# Patient Record
Sex: Male | Born: 1985 | Race: Black or African American | Hispanic: No | Marital: Single | Smoking: Current some day smoker
Health system: Southern US, Community
[De-identification: ages and names within clinical notes are randomized; demographics above are authoritative.]

## PROBLEM LIST (undated history)

## (undated) DIAGNOSIS — H00019 Hordeolum externum unspecified eye, unspecified eyelid: Secondary | ICD-10-CM

## (undated) DIAGNOSIS — E669 Obesity, unspecified: Secondary | ICD-10-CM

## (undated) DIAGNOSIS — M25569 Pain in unspecified knee: Secondary | ICD-10-CM

## (undated) HISTORY — DX: Hordeolum externum unspecified eye, unspecified eyelid: H00.019

## (undated) HISTORY — DX: Pain in unspecified knee: M25.569

## (undated) HISTORY — DX: Obesity, unspecified: E66.9

---
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
## 2009-10-09 ENCOUNTER — Emergency Department (HOSPITAL_COMMUNITY): Admission: EM | Admit: 2009-10-09 | Discharge: 2009-10-09 | Payer: Self-pay | Admitting: Emergency Medicine

## 2010-10-06 ENCOUNTER — Emergency Department (HOSPITAL_COMMUNITY)
Admission: EM | Admit: 2010-10-06 | Discharge: 2010-10-06 | Discharge status: Against Medical Advice | Payer: Managed Care, Other (non HMO) | Attending: Emergency Medicine | Admitting: Emergency Medicine

## 2010-10-06 DIAGNOSIS — Z0389 Encounter for observation for other suspected diseases and conditions ruled out: Secondary | ICD-10-CM | POA: Insufficient documentation

## 2011-12-27 ENCOUNTER — Encounter (HOSPITAL_COMMUNITY): Payer: Self-pay | Admitting: Family Medicine

## 2011-12-27 ENCOUNTER — Emergency Department (HOSPITAL_COMMUNITY)
Admission: EM | Admit: 2011-12-27 | Discharge: 2011-12-27 | Disposition: A | Discharge status: Home / Self Care | Payer: Self-pay | Attending: Emergency Medicine | Admitting: Emergency Medicine

## 2011-12-27 DIAGNOSIS — H60399 Other infective otitis externa, unspecified ear: Secondary | ICD-10-CM | POA: Insufficient documentation

## 2011-12-27 DIAGNOSIS — F17200 Nicotine dependence, unspecified, uncomplicated: Secondary | ICD-10-CM | POA: Insufficient documentation

## 2011-12-27 DIAGNOSIS — H6091 Unspecified otitis externa, right ear: Secondary | ICD-10-CM

## 2011-12-27 MED ORDER — CIPROFLOXACIN-DEXAMETHASONE 0.3-0.1 % OT SUSP: 4.0000 [drp] | Freq: Two times a day (BID) | OTIC | Status: AC

## 2011-12-27 NOTE — ED Provider Notes (Signed)
History     CSN: [id number]  Arrival date & time 12/27/11  1924   First MD Initiated Contact with Patient 12/27/11 2042      Chief Complaint  Patient presents with  . Otalgia  . Headache    (Consider location/radiation/quality/duration/timing/severity/associated sxs/prior treatment) HPI  26 year old male presents complaining of right ear pain. Patient reports for the past 3 days he has pain to his right ear. Describe pain as a sharp and throbbing sensation. Pain worsened when he lays on the right side. He also notice some discharge from it. He denies hearing changes, ringing ears, or recent trauma. He does admits to using Q-tips in his ear and regular basis. He denies history of diabetes. Denies any medication changes. He denies recent swimming in lake, pond, pool.    History reviewed. No pertinent past medical history.  History reviewed. No pertinent past surgical history.  History reviewed. No pertinent family history.  History  Substance Use Topics  . Smoking status: Current Every Day Smoker  . Smokeless tobacco: Not on file  . Alcohol Use: No      Review of Systems  All other systems reviewed and are negative.    Allergies  Review of patient's allergies indicates no known allergies.  Home Medications   Current Outpatient Rx  Name Route Sig Dispense Refill  . BC HEADACHE POWDER PO Oral Take 1 packet by mouth once.      BP 128/69  Pulse 71  Temp 98.4 F (36.9 C) (Oral)  Resp 20  Ht 6\' 1"  (1.854 m)  SpO2 99%  Physical Exam  Nursing note and vitals reviewed. Constitutional: He appears well-developed and well-nourished. No distress.  HENT:  Head: Normocephalic and atraumatic.  Right Ear: There is drainage and tenderness. No swelling. No foreign bodies. No mastoid tenderness. Tympanic membrane is not perforated. No middle ear effusion. No hemotympanum. No decreased hearing is noted.  Left Ear: External ear normal.  Mouth/Throat: Oropharynx is clear and  moist. No oropharyngeal exudate.       R ear canal with erythema and pustular discharge, no edema, TM appears normal.  Tenderness to tragus and ear lobe with palpation.    Eyes: Conjunctivae normal are normal.  Neck: Normal range of motion. Neck supple.  Lymphadenopathy:    He has no cervical adenopathy.  Neurological: He is alert.  Skin: Skin is warm. No rash noted.  Psychiatric: He has a normal mood and affect.    ED Course  Procedures (including critical care time)  Labs Reviewed - No data to display No results found.   No diagnosis found.  1. Otitis externa, Right ear  MDM  Otitis externa 2/2 using q-tip.  Will treat with ciprodex.  Recommend avoid q-tip.  ent referral given.     BP 128/69  Pulse 71  Temp 98.4 F (36.9 C) (Oral)  Resp 20  Ht 6\' 1"  (1.854 m)  SpO2 99%      Fayrene Helper, PA-C 12/27/11 2123

## 2011-12-27 NOTE — ED Provider Notes (Signed)
Medical screening examination/treatment/procedure(s) were performed by non-physician practitioner and as supervising physician I was immediately available for consultation/collaboration.  Cheri Guppy, MD 12/27/11 [phone]

## 2011-12-27 NOTE — ED Notes (Signed)
Pt reports right-sided earache beginning x 3 days ago and states pain is going into the right side of his head now.

## 2013-04-19 ENCOUNTER — Emergency Department (HOSPITAL_COMMUNITY)
Admission: EM | Admit: 2013-04-19 | Discharge: 2013-04-19 | Disposition: A | Discharge status: Home / Self Care | Payer: Managed Care, Other (non HMO) | Attending: Emergency Medicine | Admitting: Emergency Medicine

## 2013-04-19 ENCOUNTER — Encounter (HOSPITAL_COMMUNITY): Payer: Self-pay | Admitting: Emergency Medicine

## 2013-04-19 DIAGNOSIS — S058X9A Other injuries of unspecified eye and orbit, initial encounter: Secondary | ICD-10-CM | POA: Insufficient documentation

## 2013-04-19 DIAGNOSIS — F17200 Nicotine dependence, unspecified, uncomplicated: Secondary | ICD-10-CM | POA: Insufficient documentation

## 2013-04-19 DIAGNOSIS — IMO0002 Reserved for concepts with insufficient information to code with codable children: Secondary | ICD-10-CM | POA: Insufficient documentation

## 2013-04-19 DIAGNOSIS — Y92410 Unspecified street and highway as the place of occurrence of the external cause: Secondary | ICD-10-CM | POA: Insufficient documentation

## 2013-04-19 DIAGNOSIS — Y9389 Activity, other specified: Secondary | ICD-10-CM | POA: Insufficient documentation

## 2013-04-19 DIAGNOSIS — M545 Low back pain, unspecified: Secondary | ICD-10-CM

## 2013-04-19 DIAGNOSIS — S0501XA Injury of conjunctiva and corneal abrasion without foreign body, right eye, initial encounter: Secondary | ICD-10-CM

## 2013-04-19 MED ORDER — FLUORESCEIN SODIUM 1 MG OP STRP
1.0000 | ORAL_STRIP | Freq: Once | OPHTHALMIC | Status: DC
  Filled 2013-04-19: qty 1

## 2013-04-19 MED ORDER — IBUPROFEN 800 MG PO TABS: 800.0000 mg | ORAL_TABLET | Freq: Three times a day (TID) | ORAL | Status: DC | PRN

## 2013-04-19 MED ORDER — TETRACAINE HCL 0.5 % OP SOLN
2.0000 [drp] | Freq: Once | OPHTHALMIC | Status: DC
  Filled 2013-04-19: qty 2

## 2013-04-19 MED ORDER — CYCLOBENZAPRINE HCL 10 MG PO TABS: 10.0000 mg | ORAL_TABLET | Freq: Three times a day (TID) | ORAL | Status: DC | PRN

## 2013-04-19 MED ORDER — CIPROFLOXACIN HCL 0.3 % OP SOLN: 1.0000 [drp] | Freq: Four times a day (QID) | OPHTHALMIC | Status: DC
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
## 2013-04-19 NOTE — ED Notes (Signed)
Tetracaine and fluorescein strip placed at bedside per [employer] [facility] request

## 2013-04-20 NOTE — ED Provider Notes (Signed)
Medical screening examination/treatment/procedure(s) were performed by non-physician practitioner and as supervising physician I was immediately available for consultation/collaboration.  EKG Interpretation   None        Symphonie Schneiderman, MD 04/20/13 0025 

## 2013-04-23 ENCOUNTER — Emergency Department (HOSPITAL_COMMUNITY)
Admission: EM | Admit: 2013-04-23 | Discharge: 2013-04-23 | Disposition: A | Discharge status: Home / Self Care | Payer: Managed Care, Other (non HMO) | Attending: Emergency Medicine | Admitting: Emergency Medicine

## 2013-04-23 ENCOUNTER — Encounter (HOSPITAL_COMMUNITY): Payer: Self-pay | Admitting: Emergency Medicine

## 2013-04-23 DIAGNOSIS — F17200 Nicotine dependence, unspecified, uncomplicated: Secondary | ICD-10-CM | POA: Insufficient documentation

## 2013-04-23 DIAGNOSIS — R52 Pain, unspecified: Secondary | ICD-10-CM | POA: Insufficient documentation

## 2013-04-23 DIAGNOSIS — J069 Acute upper respiratory infection, unspecified: Secondary | ICD-10-CM | POA: Insufficient documentation

## 2013-04-23 MED ORDER — PSEUDOEPHEDRINE HCL 30 MG PO TABS: 30.0000 mg | ORAL_TABLET | ORAL | Status: DC | PRN

## 2013-04-23 NOTE — ED Provider Notes (Signed)
Medical screening examination/treatment/procedure(s) were performed by non-physician practitioner and as supervising physician I was immediately available for consultation/collaboration.  EKG Interpretation   None         Kelvis Berger, MD 04/23/13 2247 

## 2013-04-23 NOTE — ED Notes (Signed)
Pt from home reports dry, non-productive cough/cold s/sx 5 days. Pt has been taking mucinex and dayquil with releif, but s/sx are not going away. Pt denies N/V/D. Pt is A&O and in NAD

## 2013-04-23 NOTE — Discharge Instructions (Signed)
Cough, Adult  A cough is a reflex that helps clear your throat and airways. It can help heal the body or may be a reaction to an irritated airway. A cough may only last 2 or 3 weeks (acute) or may last more than 8 weeks (chronic).  CAUSES Acute cough:  Viral or bacterial infections. Chronic cough:  Infections.  Allergies.  Asthma.  Post-nasal drip.  Smoking.  Heartburn or acid reflux.  Some medicines.  Chronic lung problems (COPD).  Cancer. SYMPTOMS   Cough.  Fever.  Chest pain.  Increased breathing rate.  High-pitched whistling sound when breathing (wheezing).  Colored mucus that you cough up (sputum). TREATMENT   A bacterial cough may be treated with antibiotic medicine.  A viral cough must run its course and will not respond to antibiotics.  Your caregiver may recommend other treatments if you have a chronic cough. HOME CARE INSTRUCTIONS   Only take over-the-counter or prescription medicines for pain, discomfort, or fever as directed by your caregiver. Use cough suppressants only as directed by your caregiver.  Use a cold steam vaporizer or humidifier in your bedroom or home to help loosen secretions.  Sleep in a semi-upright position if your cough is worse at night.  Rest as needed.  Stop smoking if you smoke. SEEK IMMEDIATE MEDICAL CARE IF:   You have pus in your sputum.  Your cough starts to worsen.  You cannot control your cough with suppressants and are losing sleep.  You begin coughing up blood.  You have difficulty breathing.  You develop pain which is getting worse or is uncontrolled with medicine.  You have a fever. MAKE SURE YOU:   Understand these instructions.  Will watch your condition.  Will get help right away if you are not doing well or get worse. Document Released: 09/29/2010 Document Revised: 06/25/2011 Document Reviewed: 09/29/2010 [employer] Patient Information 2014 [employer].  Upper Respiratory Infection,  Adult An upper respiratory infection (URI) is also sometimes known as the common cold. The upper respiratory tract includes the nose, sinuses, throat, trachea, and bronchi. Bronchi are the airways leading to the lungs. Most people improve within 1 week, but symptoms can last up to 2 weeks. A residual cough may last even longer.  CAUSES Many different viruses can infect the tissues lining the upper respiratory tract. The tissues become irritated and inflamed and often become very moist. Mucus production is also common. A cold is contagious. You can easily spread the virus to others by oral contact. This includes kissing, sharing a glass, coughing, or sneezing. Touching your mouth or nose and then touching a surface, which is then touched by another person, can also spread the virus. SYMPTOMS  Symptoms typically develop 1 to 3 days after you come in contact with a cold virus. Symptoms vary from person to person. They may include:  Runny nose.  Sneezing.  Nasal congestion.  Sinus irritation.  Sore throat.  Loss of voice (laryngitis).  Cough.  Fatigue.  Muscle aches.  Loss of appetite.  Headache.  Low-grade fever. DIAGNOSIS  You might diagnose your own cold based on familiar symptoms, since most people get a cold 2 to 3 times a year. Your caregiver can confirm this based on your exam. Most importantly, your caregiver can check that your symptoms are not due to another disease such as strep throat, sinusitis, pneumonia, asthma, or epiglottitis. Blood tests, throat tests, and X-rays are not necessary to diagnose a common cold, but they may sometimes be helpful   in excluding other more serious diseases. Your caregiver will decide if any further tests are required. RISKS AND COMPLICATIONS  You may be at risk for a more severe case of the common cold if you smoke cigarettes, have chronic heart disease (such as heart failure) or lung disease (such as asthma), or if you have a weakened immune  system. The very young and very old are also at risk for more serious infections. Bacterial sinusitis, middle ear infections, and bacterial pneumonia can complicate the common cold. The common cold can worsen asthma and chronic obstructive pulmonary disease (COPD). Sometimes, these complications can require emergency medical care and may be life-threatening. PREVENTION  The best way to protect against getting a cold is to practice good hygiene. Avoid oral or hand contact with people with cold symptoms. Wash your hands often if contact occurs. There is no clear evidence that vitamin C, vitamin E, echinacea, or exercise reduces the chance of developing a cold. However, it is always recommended to get plenty of rest and practice good nutrition. TREATMENT  Treatment is directed at relieving symptoms. There is no cure. Antibiotics are not effective, because the infection is caused by a virus, not by bacteria. Treatment may include:  Increased fluid intake. Sports drinks offer valuable electrolytes, sugars, and fluids.  Breathing heated mist or steam (vaporizer or shower).  Eating chicken soup or other clear broths, and maintaining good nutrition.  Getting plenty of rest.  Using gargles or lozenges for comfort.  Controlling fevers with ibuprofen or acetaminophen as directed by your caregiver.  Increasing usage of your inhaler if you have asthma. Zinc gel and zinc lozenges, taken in the first 24 hours of the common cold, can shorten the duration and lessen the severity of symptoms. Pain medicines may help with fever, muscle aches, and throat pain. A variety of non-prescription medicines are available to treat congestion and runny nose. Your caregiver can make recommendations and may suggest nasal or lung inhalers for other symptoms.  HOME CARE INSTRUCTIONS   Only take over-the-counter or prescription medicines for pain, discomfort, or fever as directed by your caregiver.  Use a warm mist humidifier  or inhale steam from a shower to increase air moisture. This may keep secretions moist and make it easier to breathe.  Drink enough water and fluids to keep your urine clear or pale yellow.  Rest as needed.  Return to work when your temperature has returned to normal or as your caregiver advises. You may need to stay home longer to avoid infecting others. You can also use a face mask and careful hand washing to prevent spread of the virus. SEEK MEDICAL CARE IF:   After the first few days, you feel you are getting worse rather than better.  You need your caregiver's advice about medicines to control symptoms.  You develop chills, worsening shortness of breath, or brown or red sputum. These may be signs of pneumonia.  You develop yellow or brown nasal discharge or pain in the face, especially when you bend forward. These may be signs of sinusitis.  You develop a fever, swollen neck glands, pain with swallowing, or white areas in the back of your throat. These may be signs of strep throat. SEEK IMMEDIATE MEDICAL CARE IF:   You have a fever.  You develop severe or persistent headache, ear pain, sinus pain, or chest pain.  You develop wheezing, a prolonged cough, cough up blood, or have a change in your usual mucus (if you have chronic   lung disease).  You develop sore muscles or a stiff neck. Document Released: 09/26/2000 Document Revised: 06/25/2011 Document Reviewed: 08/04/2010 [employer] Patient Information 2014 [employer].  

## 2013-04-23 NOTE — ED Provider Notes (Signed)
CSN: [id number]     Arrival date & time 04/23/13  1629 History  This chart was scribed for non-physician practitioner, Wylene SimmerFrancis Arrow Tomko, PA-C,working with Gwyneth SproutWhitney Plunkett, MD, by Karle PlumberJennifer Tensley, ED Scribe.  This patient was seen in room WTR8/WTR8 and the patient's care was started at 6:17 PM.  Chief Complaint  Patient presents with  . Cough  . Nasal Congestion  . Generalized Body Aches   The history is provided by the patient. No language interpreter was used.   HPI Comments:  Donald GreenspanDarren Clay is a 28 y.o. male who presents to the Emergency Department complaining of severe, intermittent dry cough for the past five days. Pt reports associated rhinorrhea, intermittent sore throat and intermittent CP yesterday. His partner reports shortness of breath while pt sleeps and states she believes he has sleep apnea. Pt reports sick contacts at work. Pt denies fever, appetite change, nausea, or vomiting. He reports tobacco use of 1-2 cigars daily for the past three years.  History reviewed. No pertinent past medical history. No past surgical history on file. No family history on file. History  Substance Use Topics  . Smoking status: Current Every Day Smoker    Types: Cigars  . Smokeless tobacco: Not on file  . Alcohol Use: No    Review of Systems  Constitutional: Positive for chills. Negative for fever and appetite change.       Generalized body aches.  HENT: Positive for congestion, rhinorrhea and sore throat.   Respiratory: Positive for cough.   Gastrointestinal: Negative for nausea and vomiting.  All other systems reviewed and are negative.   Allergies  Review of patient's allergies indicates no known allergies.  Home Medications   Current Outpatient Rx  Name  Route  Sig  Dispense  Refill  . Aspirin-Salicylamide-Caffeine (BC HEADACHE POWDER PO)   Oral   Take 1 packet by mouth once.         . ciprofloxacin (CILOXAN) 0.3 % ophthalmic solution   Right Eye   Place 1-2 drops into the  right eye 4 (four) times daily. X 5 days   5 mL   0   . cyclobenzaprine (FLEXERIL) 10 MG tablet   Oral   Take 1 tablet (10 mg total) by mouth 3 (three) times daily as needed for muscle spasms.   10 tablet   0   . ibuprofen (ADVIL,MOTRIN) 200 MG tablet   Oral   Take 200 mg by mouth every 6 (six) hours as needed for moderate pain (tooth ache).         Marland Kitchen. ibuprofen (ADVIL,MOTRIN) 800 MG tablet   Oral   Take 1 tablet (800 mg total) by mouth every 8 (eight) hours as needed for mild pain or moderate pain.   15 tablet   0    Triage Vitals: BP 141/64  Pulse 78  Temp(Src) 98.4 F (36.9 C) (Oral)  Resp 18  SpO2 98% Physical Exam  Nursing note and vitals reviewed. Constitutional: He is oriented to person, place, and time. He appears well-developed and well-nourished. No distress.  HENT:  Head: Normocephalic and atraumatic.  Right Ear: External ear normal.  Left Ear: External ear normal.  Mouth/Throat: Oropharynx is clear and moist. No oropharyngeal exudate.  Boggy nasal mucosa, clear rhinorrhea   Eyes: Conjunctivae are normal. Pupils are equal, round, and reactive to light. No scleral icterus.  Neck: Normal range of motion. Neck supple.  Cardiovascular: Normal rate, regular rhythm and normal heart sounds.  Exam reveals no gallop and  no friction rub.   No murmur heard. Pulmonary/Chest: Effort normal and breath sounds normal. No respiratory distress. He has no wheezes. He has no rales. He exhibits no tenderness.  Abdominal: Soft. Bowel sounds are normal. He exhibits no distension. There is no tenderness. There is no rebound and no guarding.  Musculoskeletal: Normal range of motion. He exhibits no edema and no tenderness.  Lymphadenopathy:    He has no cervical adenopathy.  Neurological: He is alert and oriented to person, place, and time. He exhibits normal muscle tone. Coordination normal.  Skin: Skin is warm and dry. No rash noted. No erythema. No pallor.  Psychiatric: He has a  normal mood and affect. His behavior is normal. Judgment and thought content normal.    ED Course  Procedures (including critical care time) DIAGNOSTIC STUDIES: Oxygen Saturation is 98% on RA, normal by my interpretation.   COORDINATION OF CARE: 6:22 PM- Will treat with a nasal decongestant. Pt verbalizes understanding and agrees to plan.  Medications - No data to display  Labs Review Labs Reviewed - No data to display Imaging Review No results found.  EKG Interpretation   None       MDM  URI  Patient is otherwise healthy male who presents with mild URI symptoms, there is no evidence of influenza, he reports dry and hacking cough which I believe is mainly from the post nasal drip - will start on nasal decongestant for symptoms.  I personally performed the services described in this documentation, which was scribed in my presence. The recorded information has been reviewed and is accurate.    Izola Price Marisue Humble, [place] 04/23/13 1829

## 2013-05-04 ENCOUNTER — Emergency Department (HOSPITAL_COMMUNITY): Payer: Managed Care, Other (non HMO)

## 2013-05-04 ENCOUNTER — Encounter (HOSPITAL_COMMUNITY): Payer: Self-pay | Admitting: Emergency Medicine

## 2013-05-04 ENCOUNTER — Emergency Department (HOSPITAL_COMMUNITY)
Admission: EM | Admit: 2013-05-04 | Discharge: 2013-05-04 | Disposition: A | Discharge status: Home / Self Care | Payer: Managed Care, Other (non HMO) | Attending: Emergency Medicine | Admitting: Emergency Medicine

## 2013-05-04 DIAGNOSIS — R509 Fever, unspecified: Secondary | ICD-10-CM | POA: Insufficient documentation

## 2013-05-04 DIAGNOSIS — R110 Nausea: Secondary | ICD-10-CM | POA: Insufficient documentation

## 2013-05-04 DIAGNOSIS — J40 Bronchitis, not specified as acute or chronic: Secondary | ICD-10-CM

## 2013-05-04 DIAGNOSIS — F17200 Nicotine dependence, unspecified, uncomplicated: Secondary | ICD-10-CM | POA: Insufficient documentation

## 2013-05-04 MED ORDER — PREDNISONE (PAK) 10 MG PO TABS: ORAL_TABLET | Freq: Every day | ORAL | Status: AC

## 2013-05-04 MED ORDER — HYDROCODONE-HOMATROPINE 5-1.5 MG/5ML PO SYRP: 5.0000 mL | ORAL_SOLUTION | Freq: Four times a day (QID) | ORAL | Status: DC | PRN

## 2013-05-04 NOTE — ED Provider Notes (Signed)
CSN: [id number]     Arrival date & time 05/04/13  1750 History  This chart was scribed for non-physician practitioner, Trixie Dredge, PA-C working with Juliet Rude. Rubin Payor, MD by Luisa Dago, ED scribe. This patient was seen in room WTR8/WTR8 and the patient's care was started at 6:57 PM.    Chief Complaint  Patient presents with  . Cough  . Fever    The history is provided by the patient. No language interpreter was used.   HPI Comments: Donald Clay is a 28 y.o. male who presents to the Emergency Department complaining of a dry cough that started 3 weeks ago. Pt is also complaining of associated occasional fever that also started 3 weeks ago, with a measured TMAX of 104. ED temperature is now 99.8. He reports taking Nyquil and other OTC medication with minimal to no relief. Pt is complaining of associated nausea and congestion. He denies any abdominal pain, emesis, or leg swelling. Pt did not get a Flu vaccine this season.    No past medical history on file. No past surgical history on file. No family history on file. History  Substance Use Topics  . Smoking status: Current Every Day Smoker    Types: Cigars  . Smokeless tobacco: Not on file  . Alcohol Use: No    Review of Systems  Constitutional: Positive for fever.  HENT: Positive for congestion.   Respiratory: Positive for cough.   Gastrointestinal: Positive for nausea. Negative for vomiting.  Musculoskeletal: Negative for joint swelling.    Allergies  Review of patient's allergies indicates no known allergies.  Home Medications   Current Outpatient Rx  Name  Route  Sig  Dispense  Refill  . cyclobenzaprine (FLEXERIL) 10 MG tablet   Oral   Take 1 tablet (10 mg total) by mouth 3 (three) times daily as needed for muscle spasms.   10 tablet   0   . DM-Doxylamine-Acetaminophen (NYQUIL COLD & FLU PO)   Oral   Take 30 mLs by mouth at bedtime as needed (flu-like symptoms).         Marland Kitchen ibuprofen (ADVIL,MOTRIN) 800 MG  tablet   Oral   Take 1 tablet (800 mg total) by mouth every 8 (eight) hours as needed for mild pain or moderate pain.   15 tablet   0   . pseudoephedrine (SUDAFED) 30 MG tablet   Oral   Take 1 tablet (30 mg total) by mouth every 4 (four) hours as needed for congestion.   30 tablet   0    Triage vitals: BP 141/81  Pulse 94  Temp(Src) 99.3 F (37.4 C) (Oral)  Resp 20  Ht 6\' 1"  (1.854 m)  Wt 320 lb (145.151 kg)  BMI 42.23 kg/m2  SpO2 100%  Physical Exam  Nursing note and vitals reviewed. Constitutional: He appears well-developed and well-nourished. No distress.  HENT:  Head: Normocephalic and atraumatic.  Nose: Right sinus exhibits no maxillary sinus tenderness and no frontal sinus tenderness. Left sinus exhibits no maxillary sinus tenderness and no frontal sinus tenderness.  Mouth/Throat: Oropharynx is clear and moist.  Neck: Neck supple.  Cardiovascular: Normal rate and regular rhythm.   Pulmonary/Chest: Effort normal and breath sounds normal. No respiratory distress. He has no wheezes. He has no rales.  Abdominal: Soft. He exhibits no distension and no mass. There is no tenderness. There is no rebound and no guarding.  Lymphadenopathy:    He has no cervical adenopathy.  Neurological: He is alert. He exhibits  normal muscle tone.  Skin: He is not diaphoretic.    ED Course  Procedures (including critical care time)  DIAGNOSTIC STUDIES: Oxygen Saturation is 100% on RA, normal by my interpretation.    COORDINATION OF CARE: 6:55 PM- Will order CXR. Pt advised of plan for treatment and pt agrees.  Labs Review Labs Reviewed - No data to display Imaging Review Dg Chest 2 View  05/04/2013   CLINICAL DATA:  Cough and fever.  EXAM: CHEST  2 VIEW  COMPARISON:  None.  FINDINGS: Heart size and mediastinal contours are within normal limits. Both lungs are clear. Visualized skeletal structures are unremarkable.  IMPRESSION: Negative exam.   Electronically Signed   By: Drusilla Kannerhomas   Dalessio M.D.   On: 05/04/2013 19:30    EKG Interpretation   None       MDM   1. Bronchitis    Afebrile, nontoxic patient with cough x 3 weeks.  No SOB.  O2 100%. Temp is normal here.  CXR is negative.  Likely bronchitis.  D/C home with prednisone, hycodan.  Discussed result, findings, treatment, and follow up  with patient.  Pt given return precautions.  Pt verbalizes understanding and agrees with plan.      I personally performed the services described in this documentation, which was scribed in my presence. The recorded information has been reviewed and is accurate.    Trixie Dredgemily Drina Jobst, PA-C 05/04/13 1945

## 2013-05-04 NOTE — Discharge Instructions (Signed)
Read the information below.  Use the prescribed medication as directed.  Please discuss all new medications with your pharmacist.  You may return to the Emergency Department at any time for worsening condition or any new symptoms that concern you.  If you develop high fevers that do not resolve with tylenol or ibuprofen, you have difficulty swallowing or breathing, or you are unable to tolerate fluids by mouth, return to the ER for a recheck.    ° ° ° °Bronchitis °Bronchitis is inflammation of the airways that extend from the windpipe into the lungs (bronchi). The inflammation often causes mucus to develop, which leads to a cough. If the inflammation becomes severe, it may cause shortness of breath. °CAUSES  °Bronchitis may be caused by:  °· Viral infections.   °· Bacteria.   °· Cigarette smoke.   °· Allergens, pollutants, and other irritants.   °SIGNS AND SYMPTOMS  °The most common symptom of bronchitis is a frequent cough that produces mucus. Other symptoms include: °· Fever.   °· Body aches.   °· Chest congestion.   °· Chills.   °· Shortness of breath.   °· Sore throat.   °DIAGNOSIS  °Bronchitis is usually diagnosed through a medical history and physical exam. Tests, such as chest X-rays, are sometimes done to rule out other conditions.  °TREATMENT  °You may need to avoid contact with whatever caused the problem (smoking, for example). Medicines are sometimes needed. These may include: °· Antibiotics. These may be prescribed if the condition is caused by bacteria. °· Cough suppressants. These may be prescribed for relief of cough symptoms.   °· Inhaled medicines. These may be prescribed to help open your airways and make it easier for you to breathe.   °· Steroid medicines. These may be prescribed for those with recurrent (chronic) bronchitis. °HOME CARE INSTRUCTIONS °· Get plenty of rest.   °· Drink enough fluids to keep your urine clear or pale yellow (unless you have a medical condition that requires fluid  restriction). Increasing fluids may help thin your secretions and will prevent dehydration.   °· Only take over-the-counter or prescription medicines as directed by your health care provider. °· Only take antibiotics as directed. Make sure you finish them even if you start to feel better. °· Avoid secondhand smoke, irritating chemicals, and strong fumes. These will make bronchitis worse. If you are a smoker, quit smoking. Consider using nicotine gum or skin patches to help control withdrawal symptoms. Quitting smoking will help your lungs heal faster.   °· Put a cool-mist humidifier in your bedroom at night to moisten the air. This may help loosen mucus. Change the water in the humidifier daily. You can also run the hot water in your shower and sit in the bathroom with the door closed for 5 10 minutes.   °· Follow up with your health care provider as directed.   °· Wash your hands frequently to avoid catching bronchitis again or spreading an infection to others.   °SEEK MEDICAL CARE IF: °Your symptoms do not improve after 1 week of treatment.  °SEEK IMMEDIATE MEDICAL CARE IF: °· Your fever increases. °· You have chills.   °· You have chest pain.   °· You have worsening shortness of breath.   °· You have bloody sputum. °· You faint.   °· You have lightheadedness. °· You have a severe headache.   °· You vomit repeatedly. °MAKE SURE YOU:  °· Understand these instructions. °· Will watch your condition. °· Will get help right away if you are not doing well or get worse. °Document Released: 04/02/2005 Document Revised: 01/21/2013 Document Reviewed: 11/25/2012 °  ExitCare® Patient Information ©2014 [employer]. ° ° ° °Emergency Department Resource Guide °1) Find a Doctor and Pay Out of Pocket °Although you won't have to find out who is covered by your insurance plan, it is a good idea to ask around and get recommendations. You will then need to call the office and see if the doctor you have chosen will accept you as a new  patient and what types of options they offer for patients who are self-pay. Some doctors offer discounts or will set up payment plans for their patients who do not have insurance, but you will need to ask so you aren't surprised when you get to your appointment. ° °2) Contact Your Local Health Department °Not all health departments have doctors that can see patients for sick visits, but many do, so it is worth a call to see if yours does. If you don't know where your local health department is, you can check in your phone book. The CDC also has a tool to help you locate your state's health department, and many state websites also have listings of all of their local health departments. ° °3) Find a Walk-in Clinic °If your illness is not likely to be very severe or complicated, you may want to try a walk in clinic. These are popping up all over the country in pharmacies, drugstores, and shopping centers. They're usually staffed by nurse practitioners or physician assistants that have been trained to treat common illnesses and complaints. They're usually fairly quick and inexpensive. However, if you have serious medical issues or chronic medical problems, these are probably not your best option. ° °No Primary Care Doctor: °- Call [facility] at  832-8000 - they can help you locate a primary care doctor that  accepts your insurance, provides certain services, etc. °- Physician Referral Service- 1-800-533-3463 ° °Chronic Pain Problems: °Organization         Address  Phone   Notes  °[facility]  (336) 297-2271 Patients need to be referred by their primary care doctor.  ° °Medication Assistance: °Organization         Address  Phone   Notes  °[place] [facility] [address] °[place], [place] [address] (336) 641-8030 --Must be a resident of [place] °-- Must have NO insurance coverage whatsoever (no Medicaid/ Medicare, etc.) °-- The pt. MUST have a primary  care doctor that directs their care regularly and follows them in the community °  °[facility]  (866) 331-1348   °[employer]  (888) 892-1162   ° °Agencies that provide inexpensive medical care: °Organization         Address  Phone   Notes  °[facility]  (336) 832-8035   °[facility]    (336) 832-7272   °[facility] [address] °[place], [place] [address] (336) 832-4777   °[facility] [address], °[place] (336) 271-4999   °[facility]    (336) 373-0678   °[facility]    (336) 272-1050   °[facility] ° [address], [place] Phone:  (336) 832-4444, Fax:  (336) 832-4440 Hours of Operation:  9 am - 6 pm, M-F.  Also accepts Medicaid/Medicare and self-pay.  °[facility] ° [address],  Phone: (336) 832-3150, Fax: (336) 832-3151. Hours of Operation:  8:30 am - 5:30 pm, M-F.    Also accepts Medicaid and self-pay.  °[facility] [place] [address], [place] Phone: (336) 878-6027   °[facility] [address], [place], [place] (336)723-1848, Ext. 123 Mondays & Thursdays: 7-9 AM.  First 15 patients are seen on a first come, first serve basis. °  ° °Medicaid-accepting [place] Providers: ° °Organization         Address  Phone   Notes  °[facility] [address], [place] (336) 641-2100 Also accepts self-pay patients.  °[facility] [address], [place] ° (336) 856-9996   °[facility] [address], [place] (336) 288-8857   °[facility] [address] [place] [address], [place] (336) 299-7000   °Veita Bland [address], [place]  ° (336) 373-1557 Only accepts [place] Access Medicaid patients after they have their name applied to their card.  ° °Self-Pay (no insurance) in [place]: ° °Organization         Address  Phone   Notes  °Sickle Cell Patients, [facility] [address], [place] (336) 832-1970   °[facility] [address], [place] (336) 832-4400   °[facility] ° [address], [place] (336) 992-4800   °[facility]/Dr. Osei-Bonsu ° [address] [place] [address], [place] or [address], [place] (336) 841-8500 Phone number for both [place] and [place] locations is the same.  °[facility] [address], [place] (336) 299-0000   °[facility] [address] [place] [address], [place] or [address] (336) 852-7530 °(336) 878-2260   °[facility] [address], [place] (336) 350-1642, phone; (336) 294-5005, fax Sees patients 1st and 3rd Saturday of every month.  Must not qualify for public or private insurance (i.e. Medicaid, Medicare, [place] Health Choice, Veterans' Benefits) • Household income should be no more than 200% of the poverty level •The clinic cannot treat you if you are pregnant or think you are pregnant • Sexually transmitted diseases are not treated at the clinic.  ° ° °Dental Care: °Organization         Address  Phone  Notes  °[place] [facility] [address], [place] (336) 641-6152 Accepts children up to age 21 who are enrolled in Medicaid or [place] Health Choice; pregnant women with a Medicaid card; and children who have applied for Medicaid or [place] Health Choice, but were declined, whose parents can pay a reduced fee at time of service.  °[place] [facility] [place]  [address], [place] (336) 641-7733 Accepts children up to age 21 who are enrolled in Medicaid or [place] Health Choice; pregnant women with a Medicaid card; and children who have applied for Medicaid or [place] Health Choice, but were declined, whose parents can  pay a reduced fee at time of service.  °[facility] ° [address],  (336) 641-4533 Patients are seen by appointment only. Walk-ins are not accepted. [facility] will see patients 18 years of age and older. °Monday - Tuesday (8am-5pm) °Most Wednesdays (8:30-5pm) °$30 per visit, cash only  °[facility] ° [address], [place] (336) 641-4533 Patients are seen by appointment only. Walk-ins are not accepted. [facility]   see patients 26 years of age and older. One Wednesday Evening (Monthly: Volunteer Based).  $30 per visit, cash only  [facility]  [phone] for adults; Children under age 154, call Graduate Pediatric Dentistry at [phone]) [phone]. Children aged 634-14, please call [phone] to request a pediatric application.  Dental services are provided in all areas of dental care including fillings, crowns and bridges, complete and partial dentures, implants, gum treatment, root canals, and extractions. Preventive care is also provided. Treatment is provided to both adults and children. Patients are selected via a lottery and there is often a waiting list.   [facility] [address], [place]  [phone]) [phone] [email or web address]   [facility] [address], [place], [place] [phone], Ext. 123 Second and Fourth Thursday of each month, opens at 6:30 AM; Clinic ends at 9 AM.  Patients are seen on a first-come first-served basis, and a limited number are seen during each clinic.   [facility]  [address], [place], [place] [phone]) [phone]   Eligibility Requirements You must have lived in [place], [place], or [place] for at least the last three months.   You cannot be eligible for state or federal sponsored [place]healthcare insurance, including CIG[employer]eterans Administration, [place]icaid, or [employer]icare.   You generally cannot be eligible for healthcare  insurance through your employer.    How to apply: Eligibility screenings are held every Tuesday and Wednesday afternoon from 1:00 pm until 4:00 pm. You do not need an appointment for the interview!  [facility] [address], [place], [place] [phone]   [facility]  [phone]   [facility]  [phone]   [facility]  [phone]) [phone]    Behavioral Health Resources in the Community: Intensive Outpatient Programs Organization         Address  Phone  Notes  [facility] [address], [place], [place] [phone]   [facility] [address], [place], [place] [phone]   [facility] [address], [place], [place]  [phone]   [facility] [address],  [place], [place] [phone] or [phone]   Substance Abuse Resources Organization         Address  Phone  Notes  [facility]  [phone]([phone]) [phone]   [facility]  (249) 282-1732(337) [phone]   [facility]  [phone]   [facility]  [phone]   Residential & Outpatient Substance Abuse Program  [phone]   Psychological Services Organization         Address  Phone  Notes  [facility]  [phone]- [phone]   [facility]  [phone]- [phone]   [facility] [address], [place] [phone] or [phone]    Mobile Crisis Teams Organization         Address  Phone  Notes  [facility], Mobile Crisis Care Unit  [phone]   [facility]  [address] [place], [place] [phone]   Doristine LocksSharon DeEsch [address] [place] [phone]    Self-Help/Support Groups Organization         Address  Phone             Notes  [facility] [place] - variety of support groups  336- [phone] Call for more information  Narcotics Anonymous (NA),  [facility] [address],  Dr, °[place]  2 meetings at this location  ° °Residential Treatment Programs °Organization         Address  Phone  Notes  °[facility] [address],    °[place]  1-866-801-8205   °[facility] ° [address], [place], [place] [phone]   °[facility] [address], [place] [phone] Admissions: 8am-3pm M-F  °[facility] [address]    °[place], [place] [phone]   °[facility] [address], [place], [place] [phone]   °[facility] [address]  °[place], [place] [phone]   °[facility] - Intensive Outpatient [address], [place], [place] 336-852-3033   °[facility] [address]  °[place], [place] 1-877-615-2722 or 336-784-9470   °[facility] [address] [place], [place] 336-227-7417 Accepts Medicaid  °[facility] [address]  °[place] 1-800-659-3381 Substance Abuse/Addiction Treatment  ° °[place] Behavioral Health Resources °Organization         Address  Phone  Notes  °[facility]  (888) 581-9988   °Julie Brannon, PhD [address] [place], [place]   (336) 349-5553 or (336) 951-0000   °[facility]   [address] °[place], [place] (336) 349-4454   °[facility] [address], [place], [place] (336) 342-8316 Insurance/Medicaid/sponsorship through [facility]  °[facility] [address]                                    [place], [place] (336) [phone] Therapy/tele-psych/case  °[facility] [address]  ° [place], [place] (336) [phone]    °Dr. Arfeen  (336) 349-4544   °[facility] [place]  [employer] [place] [facility] 1) [address], [place] °2) [address], [place] °3)  [address], [place] (336) 349-3220 °(336) 342-7768 ° °(336) 342-8140    °[place] Child Abuse Hotline (336) 342-1394 or (336) 342-3537 (After Hours)    ° ° ° °

## 2013-05-04 NOTE — ED Provider Notes (Signed)
Medical screening examination/treatment/procedure(s) were performed by non-physician practitioner and as supervising physician I was immediately available for consultation/collaboration.  EKG Interpretation   None        Marguriete Wootan R. Nidal Rivet, MD 05/04/13 2349 

## 2013-05-04 NOTE — ED Notes (Addendum)
Patient reports hving a productive cough with yellow sputum, slight body aches, and a low grade temperature x 3 weeks. Patient reports that he was here a week ago for the same, but not getting any better.

## 2014-02-20 ENCOUNTER — Emergency Department (HOSPITAL_COMMUNITY)
Admission: EM | Admit: 2014-02-20 | Discharge: 2014-02-20 | Disposition: A | Discharge status: Home / Self Care | Payer: Managed Care, Other (non HMO) | Attending: Emergency Medicine | Admitting: Emergency Medicine

## 2014-02-20 ENCOUNTER — Encounter (HOSPITAL_COMMUNITY): Payer: Self-pay | Admitting: Emergency Medicine

## 2014-02-20 DIAGNOSIS — Z720 Tobacco use: Secondary | ICD-10-CM | POA: Diagnosis not present

## 2014-02-20 DIAGNOSIS — Z79899 Other long term (current) drug therapy: Secondary | ICD-10-CM | POA: Diagnosis not present

## 2014-02-20 DIAGNOSIS — K0889 Other specified disorders of teeth and supporting structures: Secondary | ICD-10-CM

## 2014-02-20 DIAGNOSIS — K088 Other specified disorders of teeth and supporting structures: Secondary | ICD-10-CM | POA: Diagnosis present

## 2014-02-20 DIAGNOSIS — Z7952 Long term (current) use of systemic steroids: Secondary | ICD-10-CM | POA: Diagnosis not present

## 2014-02-20 MED ORDER — TRAMADOL HCL 50 MG PO TABS: 50.0000 mg | ORAL_TABLET | Freq: Four times a day (QID) | ORAL | Status: DC | PRN

## 2014-02-20 NOTE — ED Provider Notes (Signed)
CSN: [id number]     Arrival date & time 02/20/14  1808 History  This chart was scribed for Santiago GladHeather Levell Tavano, PA with Juliet RudeNathan R. Rubin PayorPickering, MD by Tonye RoyaltyJoshua Chen, ED Scribe. This patient was seen in room WTR5/WTR5 and the patient's care was started at 6:45 PM.    Chief Complaint  Patient presents with  . Dental Pain   The history is provided by the patient. No language interpreter was used.    HPI Comments: Donald Clay is a 28 y.o. male who presents to the Emergency Department complaining of left sided dental pain and headache with onset 1 month ago. He states he suspects it is due to his left lower wisdom tooth that is coming in sideways, but he states the pain is too generalized to be sure. He reports associated swelling to his face upon waking, which has now resolved. He states he has seen a dentist, but refused having the tooth removed because his dentist does not use anesthesia. He states he has been using 800mg  Ibuprofen and an antibiotic for his symptoms. He denies fever, chills, nausea, vomiting, or difficulty swallowing.  No past medical history on file. No past surgical history on file. Family History  Problem Relation Age of Onset  . Hypertension Mother   . Asthma Mother    History  Substance Use Topics  . Smoking status: Current Some Day Smoker    Types: Cigars  . Smokeless tobacco: Not on file  . Alcohol Use: No    Review of Systems  Constitutional: Negative for fever and chills.  HENT: Positive for dental problem and facial swelling.   Gastrointestinal: Negative for nausea and vomiting.  Neurological: Positive for headaches.  All other systems reviewed and are negative.     Allergies  Review of patient's allergies indicates no known allergies.  Home Medications   Prior to Admission medications   Medication Sig Start Date End Date Taking? Authorizing Provider  cyclobenzaprine (FLEXERIL) 10 MG tablet Take 1 tablet (10 mg total) by mouth 3 (three) times daily as  needed for muscle spasms. 04/19/13   Trixie DredgeEmily West, PA-C  DM-Doxylamine-Acetaminophen (NYQUIL COLD & FLU PO) Take 30 mLs by mouth at bedtime as needed (flu-like symptoms).    Historical Provider, MD  HYDROcodone-homatropine (HYCODAN) 5-1.5 MG/5ML syrup Take 5 mLs by mouth every 6 (six) hours as needed for cough. 05/04/13   Trixie DredgeEmily West, PA-C  ibuprofen (ADVIL,MOTRIN) 800 MG tablet Take 1 tablet (800 mg total) by mouth every 8 (eight) hours as needed for mild pain or moderate pain. 04/19/13   Trixie DredgeEmily West, PA-C  predniSONE (STERAPRED UNI-PAK) 10 MG tablet Take by mouth daily. Day 1: take 6 tabs.  Day 2: 5 tabs  Day 3: 4 tabs  Day 4: 3 tabs  Day 5: 2 tabs  Day 6: 1 tab 05/04/13   Trixie DredgeEmily West, PA-C  pseudoephedrine (SUDAFED) 30 MG tablet Take 1 tablet (30 mg total) by mouth every 4 (four) hours as needed for congestion. 04/23/13   Izola PriceFrances C. [place]ford, PA-C   There were no vitals taken for this visit. Physical Exam  Constitutional: He is oriented to person, place, and time. He appears well-developed and well-nourished.  HENT:  Head: Normocephalic and atraumatic.  Mouth/Throat: No trismus in the jaw.  No sublingual tenderness or swelling No submental lymphadenopathy or submandibular lymphadenopathy Partial eruption of the left lower wisdom tooth  Eyes: Conjunctivae are normal.  Neck: Normal range of motion. Neck supple.  Cardiovascular: Normal rate, regular rhythm and  normal heart sounds.   No murmur heard. Pulmonary/Chest: Effort normal and breath sounds normal. No respiratory distress. He has no wheezes. He has no rales.  Musculoskeletal: Normal range of motion.  Neurological: He is alert and oriented to person, place, and time.  Skin: Skin is warm and dry.  Psychiatric: He has a normal mood and affect.  Nursing note and vitals reviewed.   ED Course  Procedures (including critical care time)  DIAGNOSTIC STUDIES: Oxygen Saturation is 99% on room air, normal by my interpretation.    COORDINATION OF  CARE: 6:49 PM Discussed treatment plan with patient at beside, including referral to a dentist, pain medication, but no antibiotic since he is already on one. The patient agrees with the plan and has no further questions at this time.   Labs Review Labs Reviewed - No data to display  Imaging Review No results found.   EKG Interpretation None      MDM   Final diagnoses:  None   Patient with toothache.  No gross abscess.  Exam unconcerning for Ludwig's angina or spread of infection.  Will treat with pain medicine.  He is currently taking an antibiotic.  Urged patient to follow-up with dentist.      Santiago GladHeather Kacen Mellinger, PA-C 02/21/14 2352  Juliet RudeNathan R. Rubin PayorPickering, MD 02/22/14 0025

## 2014-02-20 NOTE — Discharge Instructions (Signed)
°Emergency Department Resource Guide °1) Find a Doctor and Pay Out of Pocket °Although you won't have to find out who is covered by your insurance plan, it is a good idea to ask around and get recommendations. You will then need to call the office and see if the doctor you have chosen will accept you as a new patient and what types of options they offer for patients who are self-pay. Some doctors offer discounts or will set up payment plans for their patients who do not have insurance, but you will need to ask so you aren't surprised when you get to your appointment. ° °2) Contact Your Local Health Department °Not all health departments have doctors that can see patients for sick visits, but many do, so it is worth a call to see if yours does. If you don't know where your local health department is, you can check in your phone book. The CDC also has a tool to help you locate your state's health department, and many state websites also have listings of all of their local health departments. ° °3) Find a Walk-in Clinic °If your illness is not likely to be very severe or complicated, you may want to try a walk in clinic. These are popping up all over the country in pharmacies, drugstores, and shopping centers. They're usually staffed by nurse practitioners or physician assistants that have been trained to treat common illnesses and complaints. They're usually fairly quick and inexpensive. However, if you have serious medical issues or chronic medical problems, these are probably not your best option. ° °No Primary Care Doctor: °- Call [facility] at  832-8000 - they can help you locate a primary care doctor that  accepts your insurance, provides certain services, etc. °- Physician Referral Service- 1-800-533-3463 ° °Chronic Pain Problems: °Organization         Address  Phone   Notes  °[facility]  (336) 297-2271 Patients need to be referred by their primary care doctor.  ° °Medication  Assistance: °Organization         Address  Phone   Notes  °[place] [facility] [address] °[place], [place] [address] (336) 641-8030 --Must be a resident of [place] °-- Must have NO insurance coverage whatsoever (no Medicaid/ Medicare, etc.) °-- The pt. MUST have a primary care doctor that directs their care regularly and follows them in the community °  °[facility]  (866) 331-1348   °[employer]  (888) 892-1162   ° °Agencies that provide inexpensive medical care: °Organization         Address  Phone   Notes  °[facility]  (336) 832-8035   °[facility]    (336) 832-7272   °[facility] [address] °[place], [place] [address] (336) 832-4777   °[facility] [address], °[place] (336) 271-4999   °[facility]    (336) 373-0678   °[facility]    (336) 272-1050   °[facility] ° [address], [place] Phone:  (336) 832-4444, Fax:  (336) 832-4440 Hours of Operation:  9 am - 6 pm, M-F.  Also accepts Medicaid/Medicare and self-pay.  °[facility] ° [address], [place] Phone: (336) 832-3150, Fax: (336) 832-3151. Hours of Operation:  8:30 am - 5:30 pm, M-F.  Also accepts Medicaid and self-pay.  °[facility] [place] [address], [place] Phone: (336) 878-6027   °[facility] [address], [place], [place] (336)723-1848, Ext. 123 Mondays & Thursdays: 7-9 AM.  First 15 patients are seen on a first come, first serve basis. °  ° °Medicaid-accepting [place] Providers: ° °Organization         Address  Phone   Notes  °[facility] [address], [place] (336) 641-2100 Also accepts self-pay patients.  °[facility] [address], [place] ° (336) 856-9996   °[facility] [address], [place]  (336) 288-8857   °[facility] [address] [place] [address], [place] (336) 299-7000   °Veita Bland [address], [place]  ° (336) 373-1557 Only accepts [place] Access Medicaid patients after they have their name applied to their card.  ° °Self-Pay (no insurance) in [place]: ° °Organization         Address  Phone   Notes  °Sickle Cell Patients, [facility] [address], [place] (336) 832-1970   °[facility] [address], [place] (336) 832-4400   °[facility] ° [address], [place] (336) 992-4800   °[facility]/Dr. Osei-Bonsu ° [address] [place] [address], [place] or [address], [place] (336) 841-8500 Phone number for both [place] and [place] locations is the same.  °[facility] [address], [place] (336) 299-0000   °[facility] [address] [place] [address], [place] or [address] (336) 852-7530 °(336) 878-2260   °[facility] [address], [place] (336) 350-1642, phone; (336) 294-5005, fax Sees patients 1st and 3rd Saturday of every month.  Must not qualify for public or private insurance (i.e. Medicaid, Medicare, [place] Health Choice, Veterans' Benefits) • Household income should be no more than 200% of the poverty level •The clinic cannot treat you if you are pregnant or think you are pregnant • Sexually transmitted diseases are not treated at the clinic.  ° ° °Dental Care: °Organization         Address  Phone  Notes  °[place] [facility] [address],  (336) 641-6152 Accepts children up to age 21 who are enrolled in Medicaid or [place] Health Choice; pregnant women with a Medicaid card; and children who have applied for Medicaid or [place] Health Choice, but were declined, whose parents can pay a reduced fee at time of service.  °[place]  [facility] [place]  [address], [place] (336) 641-7733 Accepts children up to age 21 who are enrolled in Medicaid or [place] Health Choice; pregnant women with a Medicaid card; and children who have applied for Medicaid or [place] Health Choice, but were declined, whose parents can pay a reduced fee at time of service.  °[facility] ° [address],  (336) 641-4533 Patients are seen by appointment only. Walk-ins are not accepted. [facility] will see patients 18 years of age and older. °Monday - Tuesday (8am-5pm) °Most Wednesdays (8:30-5pm) °$30 per visit, cash only  °[facility] ° [address], [place] (336) 641-4533 Patients are seen by appointment only. Walk-ins are not accepted. [facility] will see patients 18 years of age and older. °One   Wednesday Evening (Monthly: Volunteer Based).  $30 per visit, cash only  °[facility]  (919) 537-3737 for adults; Children under age 4, call Graduate Pediatric Dentistry at ([phone]) [phone]. Children aged 4-14, please call (919) [phone] to request a pediatric application. ° Dental services are provided in all areas of dental care including fillings, crowns and bridges, complete and partial dentures, implants, gum treatment, root canals, and extractions. Preventive care is also provided. Treatment is provided to both adults and children. °Patients are selected via a lottery and there is often a waiting list. °  °[facility] [address], °[place] ° (336) 763-8833 [email or web address] °  °[facility] [address], [place], [place] (336)723-1848, Ext. 123 Second and Fourth Thursday of each month, opens at 6:30 AM; Clinic ends at 9 AM.  Patients are seen on a first-come first-served basis, and a limited number are seen during each clinic.  ° °[facility] ° [address], [place], [place] (336) 723-7904    Eligibility Requirements °You must have lived in [place], [place], or [place] counties for at least the last three months. °  You cannot be eligible for state or federal sponsored healthcare insurance, including Veterans Administration, Medicaid, or Medicare. °  You generally cannot be eligible for healthcare insurance through your employer.  °  How to apply: °Eligibility screenings are held every Tuesday and Wednesday afternoon from 1:00 pm until 4:00 pm. You do not need an appointment for the interview!  °[facility] [address], [place], [place] 336-631-2330   °[facility]  336-342-8273   °[facility]  336-703-3100   °[facility]  336-570-6415   ° °Behavioral Health Resources in the Community: °Intensive Outpatient Programs °Organization         Address  Phone  Notes  °[facility] [address], [place], [place] 336-878-6098   °[facility] [address], [place], [place] 336-832-9800   °[facility] [address], [place], [place] ° 336-882-2125   °[facility] [address],  °[place], [place] 1-800-853-5163 or 336-641-4981   °Substance Abuse Resources °Organization         Address  Phone  Notes  °[facility]  336-882-2125   °[facility]  336-784-9470   °[facility]  336-285-9073   °[facility]  336-845-3988   °Residential & Outpatient Substance Abuse Program  1-800-659-3381   °Psychological Services °Organization         Address  Phone  Notes  °[facility]  336- 832-9600   °[facility]  336- 378-7881   °[facility] [address], [facility] 1-800-853-5163 or 336-641-4981   ° °Mobile Crisis Teams °Organization         Address  Phone  Notes  °[facility], Mobile Crisis Care Unit  1-877-626-1772   °Assertive °[facility] ° [address]  [place], [place] 336-834-9664   °Sharon DeEsch [address] °[place] 336-554-5454   ° °Self-Help/Support Groups °Organization         Address  Phone             Notes  °[facility]  - variety of support groups  336- 373-1402 Call for more information  °Narcotics Anonymous (NA), [facility] [address], °[place]   2 meetings at this location  ° °  Residential Treatment Programs °Organization         Address  Phone  Notes  °[facility] [address],    °[place]  1-866-801-8205   °[facility] ° [address], [place], [place] 704-293-8524   °[facility] [address], [place] 336-845-3988 Admissions: 8am-3pm M-F  °[facility] [address]    °[place], [place] 336-841-1104   °[facility] [address], [place], [place] 336-379-7146   °[facility] [address]  °[place], [place] 336-285-9073   °[facility] - Intensive Outpatient [address], [place], [place] 336-852-3033   °[facility] [address]  °[place], [place] 1-877-615-2722 or 336-784-9470   °[facility] ([facility] [address] [place], [place] 336-227-7417 Accepts Medicaid  °[facility] [address]  °[place] 1-800-659-3381 Substance Abuse/Addiction Treatment  ° °[place] Behavioral Health Resources °Organization         Address  Phone  Notes  °[facility]  (888) 581-9988   °Julie Brannon, PhD [address] [place], [place]   (336) 349-5553 or (336) 951-0000   ° Behavioral   [address] °[place], [place] (336) 349-4454   °[facility] [address], [place], [place] (336) 342-8316 Insurance/Medicaid/sponsorship through [facility]  °[facility] [address]                                    [place], [place] (336) 342-8316 Therapy/tele-psych/case    °[facility] [address]  ° [place], [place] (336) 349-2233    °Dr. Arfeen  (336) 349-4544   °[facility] [place]  [employer] [place] [facility] 1) [address], [place] °2) [address], [place] °3)  [address], [place] (336) 349-3220 °(336) 342-7768 ° °(336) 342-8140   °[place] [facility] (336) 342-1394 or (336) 342-3537 (After Hours)    ° ° °

## 2014-02-20 NOTE — ED Notes (Signed)
Dental pain and headache x one month. Pain to L side of mouth. States he has a wisdom tooth coming in sideways on the L side of mouth. Has been to dentist, but states he did not have work done because he wants anesthesia and his dentist does not do anesthesia. Pt states he has swelling to L side of face. Pt has no acute distress.

## 2016-01-23 ENCOUNTER — Encounter: Payer: Self-pay | Admitting: Emergency Medicine

## 2016-01-23 ENCOUNTER — Telehealth: Payer: Self-pay | Admitting: Emergency Medicine

## 2016-01-23 ENCOUNTER — Emergency Department
Admission: EM | Admit: 2016-01-23 | Discharge: 2016-01-23 | Disposition: A | Discharge status: Home / Self Care | Payer: Managed Care, Other (non HMO) | Attending: Emergency Medicine | Admitting: Emergency Medicine

## 2016-01-23 DIAGNOSIS — Z5321 Procedure and treatment not carried out due to patient leaving prior to being seen by health care provider: Secondary | ICD-10-CM | POA: Insufficient documentation

## 2016-01-23 DIAGNOSIS — F329 Major depressive disorder, single episode, unspecified: Secondary | ICD-10-CM | POA: Insufficient documentation

## 2016-01-23 DIAGNOSIS — R45851 Suicidal ideations: Secondary | ICD-10-CM | POA: Diagnosis present

## 2016-01-23 DIAGNOSIS — Z79899 Other long term (current) drug therapy: Secondary | ICD-10-CM | POA: Diagnosis not present

## 2016-01-23 DIAGNOSIS — F17290 Nicotine dependence, other tobacco product, uncomplicated: Secondary | ICD-10-CM | POA: Insufficient documentation

## 2016-01-23 NOTE — ED Triage Notes (Signed)
Pt here voluntary with SI, recent life changes and pt states he feels hopeless an depressed. No hx of SI. Denies HI.

## 2016-01-23 NOTE — Telephone Encounter (Signed)
Called patient due to lwot to inquire about condition and follow up plans. I spoke with pt .  Says he is with his mom.  I discussed walk in at [facility] for evaluation. Gave address.

## 2016-01-23 NOTE — ED Triage Notes (Signed)
After advising pt that he would have blood work taken and would have to change into hospital scrubs, pt then decided that he felt much better and did not want to stay to be seen. Pt was voluntary and was accompanied by his family. NAD noted at time of arrival, A/Ox4.

## 2016-12-31 ENCOUNTER — Emergency Department: Payer: Managed Care, Other (non HMO)

## 2016-12-31 DIAGNOSIS — Z5321 Procedure and treatment not carried out due to patient leaving prior to being seen by health care provider: Secondary | ICD-10-CM | POA: Insufficient documentation

## 2016-12-31 DIAGNOSIS — M545 Low back pain: Secondary | ICD-10-CM | POA: Insufficient documentation

## 2016-12-31 NOTE — ED Notes (Signed)
No answer when called several times from lobby for xray

## 2016-12-31 NOTE — ED Notes (Signed)
No answer when called several times times from lobby for xray

## 2016-12-31 NOTE — ED Notes (Signed)
ED Provider at bedside. 

## 2016-12-31 NOTE — ED Notes (Signed)
Patient refusing blood draw at this time

## 2016-12-31 NOTE — ED Triage Notes (Signed)
Patient reports back pain all day and radiated around into left chest.  Patient reports chest pain has resolved, but continues to have back pain.  Describes pain as sharpe.

## 2017-01-01 ENCOUNTER — Telehealth: Payer: Self-pay | Admitting: Emergency Medicine

## 2017-01-01 ENCOUNTER — Emergency Department
Admission: EM | Admit: 2017-01-01 | Discharge: 2017-01-01 | Disposition: A | Discharge status: Home / Self Care | Payer: Managed Care, Other (non HMO) | Attending: Emergency Medicine | Admitting: Emergency Medicine

## 2017-01-01 NOTE — ED Notes (Signed)
No answr when called several times from lobby 

## 2017-01-01 NOTE — Telephone Encounter (Signed)
Called patient due to lwot to inquire about condition and follow up plans. Left message.   

## 2017-05-13 ENCOUNTER — Other Ambulatory Visit: Payer: Self-pay | Admitting: Family Medicine

## 2017-05-13 ENCOUNTER — Ambulatory Visit
Admission: RE | Admit: 2017-05-13 | Discharge: 2017-05-13 | Disposition: A | Discharge status: Home / Self Care | Payer: No Typology Code available for payment source | Source: Ambulatory Visit | Attending: Family Medicine | Admitting: Family Medicine

## 2017-05-13 DIAGNOSIS — M79662 Pain in left lower leg: Secondary | ICD-10-CM | POA: Diagnosis not present

## 2017-05-13 DIAGNOSIS — M792 Neuralgia and neuritis, unspecified: Secondary | ICD-10-CM | POA: Insufficient documentation

## 2018-11-07 DIAGNOSIS — Z03818 Encounter for observation for suspected exposure to other biological agents ruled out: Secondary | ICD-10-CM | POA: Diagnosis not present

## 2018-12-25 ENCOUNTER — Telehealth: Payer: Self-pay | Admitting: Internal Medicine

## 2018-12-25 MED ORDER — IBUPROFEN 200 MG PO TABS: 200.0000 mg | ORAL_TABLET | Freq: Four times a day (QID) | ORAL | 0 refills | Status: DC | PRN

## 2018-12-25 NOTE — Telephone Encounter (Signed)
Request some ibuprofen

## 2018-12-25 NOTE — Telephone Encounter (Signed)
Samples given.  
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
## 2019-01-23 ENCOUNTER — Telehealth: Payer: Self-pay | Admitting: General Practice

## 2019-01-23 NOTE — Telephone Encounter (Signed)
Request some allergy pills

## 2019-01-23 NOTE — Telephone Encounter (Signed)
Tried to call [employer] & went straight to voice mail.  Left call back message.  AMD

## 2019-01-26 ENCOUNTER — Ambulatory Visit: Payer: Self-pay

## 2019-02-03 ENCOUNTER — Telehealth: Payer: Self-pay | Admitting: General Practice

## 2019-02-03 ENCOUNTER — Telehealth: Payer: Self-pay

## 2019-02-03 NOTE — Telephone Encounter (Signed)
Spoke with Donald Clay.  States he's got a runny nose, headache & stuffy nose.  Said he's been using some store brand medicine, but it's not helping.  Said we gave him some medicine last year that starts with an L that helped his seasonal allergies.  Loratadine 10 mg samples given.  Use as directed.  12 packs (1tab/pack) given. Lot #:  [id number] Exp:  06/14/2019  AMD

## 2019-02-03 NOTE — Telephone Encounter (Signed)
When speaking to [place] about allergy medicine.  Also advised to get OTC fluticasone & 1 STEN qd along with daily loradamed. If these don't help symptoms, will need to schedule an appt with provider for evaluation.  AMD

## 2019-02-03 NOTE — Telephone Encounter (Signed)
Request Allergy meds

## 2019-10-21 NOTE — Progress Notes (Signed)
Scheduled to complete physical 10/29/19 with Durward Parcel, PA-C.  Declined to have blood drawn for labs.  AMD

## 2019-10-22 ENCOUNTER — Ambulatory Visit: Payer: Self-pay

## 2019-10-22 ENCOUNTER — Other Ambulatory Visit: Payer: Self-pay

## 2019-10-22 DIAGNOSIS — Z0000 Encounter for general adult medical examination without abnormal findings: Secondary | ICD-10-CM

## 2019-10-22 LAB — POCT URINALYSIS DIPSTICK
Bilirubin, UA: NEGATIVE
Blood, UA: NEGATIVE
Glucose, UA: NEGATIVE
Ketones, UA: NEGATIVE
Leukocytes, UA: NEGATIVE
Nitrite, UA: NEGATIVE
Protein, UA: NEGATIVE
Spec Grav, UA: >=1.03 — AB (ref 1.010–1.025)
Urobilinogen, UA: 0.2 E.U./dL
pH, UA: 5.5 (ref 5.0–8.0)

## 2019-11-03 ENCOUNTER — Other Ambulatory Visit: Payer: Self-pay

## 2019-11-03 ENCOUNTER — Ambulatory Visit: Payer: Self-pay | Admitting: Physician Assistant

## 2019-11-03 ENCOUNTER — Encounter: Payer: Self-pay | Admitting: Physician Assistant

## 2019-11-03 VITALS — BP 124/65 | HR 82 | Temp 97.9°F | Resp 16 | Ht 72.0 in | Wt 357.0 lb

## 2019-11-03 DIAGNOSIS — Z0000 Encounter for general adult medical examination without abnormal findings: Secondary | ICD-10-CM

## 2019-11-03 DIAGNOSIS — R634 Abnormal weight loss: Secondary | ICD-10-CM

## 2019-11-03 NOTE — Progress Notes (Signed)
   Subjective: Annual physical exam    Patient ID: Donald Clay, male    DOB: Sep 14, 1985, 34 y.o.   MRN: [id number]  HPI Patient presents for annual physical exam.  Patient has voiced concern about his weight.   Review of Systems    Obesity Objective:   Physical Exam No acute distress.  BMI is 48.42.  HEENT is unremarkable.  Neck is supple without adenopathy or bruits.  Lungs clear to auscultation.  Heart regular rate and rhythm.  Abdomen distended, normoactive bowel sounds, soft, nontender to palpation.  No obvious deformity to the upper or lower extremities.  Patient has full and equal range of motion of the upper and lower extremities.  No obvious cervical or lumbar spine deformity.  Patient has full and equal range of motion of the cervical lumbar spine.  Cranial nerves II through XII grossly intact.       Assessment & Plan: Well exam.  Discussed obesity with patient.  Patient is amenable to to consult alert patient is to discuss weight loss.  Patient also is requesting medication for weight loss which have to be reevaluated by the clinical doctor.

## 2019-11-11 ENCOUNTER — Other Ambulatory Visit: Payer: Self-pay

## 2019-11-11 ENCOUNTER — Encounter: Payer: 59 | Attending: Internal Medicine | Admitting: Dietician

## 2019-11-11 VITALS — Ht 72.0 in | Wt 346.9 lb

## 2019-11-11 DIAGNOSIS — R634 Abnormal weight loss: Secondary | ICD-10-CM | POA: Diagnosis not present

## 2019-11-11 DIAGNOSIS — E669 Obesity, unspecified: Secondary | ICD-10-CM | POA: Diagnosis present

## 2019-11-11 DIAGNOSIS — Z0000 Encounter for general adult medical examination without abnormal findings: Secondary | ICD-10-CM | POA: Diagnosis not present

## 2019-11-11 DIAGNOSIS — Z713 Dietary counseling and surveillance: Secondary | ICD-10-CM | POA: Diagnosis present

## 2019-11-11 DIAGNOSIS — Z6841 Body Mass Index (BMI) 40.0 and over, adult: Secondary | ICD-10-CM | POA: Insufficient documentation

## 2019-11-11 NOTE — Progress Notes (Signed)
Medical Nutrition Therapy: Visit start time: 1027  end time: 1800  Assessment:  Diagnosis: obesity Past medical history: none Psychosocial issues/ stress concerns: pt rates stress as "low" and feels "ok" about stress management  Preferred learning method:  . Auditory . Visual . Hands-on  Current weight: 346.9 lbs  Height: 6' Medications, supplements: reconciled in medical record   Progress and evaluation:   Pt states "eating healthy is expensive"   Pt states he works two jobs and time is limited  Pt states goal weight 285 lbs, and last time at goal weight was 2009  Physical activity: ADLs  Dietary Intake:  Usual eating pattern includes 3 meals and 1-2 snacks per day. Dining out frequency: 11 meals per week.  Breakfast: five point: egg and cheese and sausage, 16 oz orange juice  Lunch: Danny's bacon cheese burger with fries/tater tots, [employer] fried chicken with bacon and cheese with fries and minute maid, McDs, Wendy's double cheeseburger with small fries and sprite 1/4 cup ranch  Supper: salad (ham or grilled chicken with lettuce, tomato, cucumber, cheese) with 1/2 cup ranch; ham and cheese sandwich   Snack: chips and cookies   Beverages: 32-40 oz  juice and soda, 64+ oz water  Cook on the weekends: Steak and burgers, corn, rice, mac and cheese; bacon and eggs    Pt follows a SAD eating pattern- high in red and processed meats, low in fruit and vegetable consumption, high in fried and calorie-dense foods, high in saturated fat and sodium, and high in sugar-sweetened beverage consumption.   Nutrition Care Education: Basic nutrition: basic food groups, appropriate nutrient balance, appropriate meal and snack schedule, general nutrition guidelines    Weight control: determining reasonable weight loss rate, importance of low sugar and low fat choices, portion control strategies Advanced nutrition:  dining out Heart Health:  healthy and unhealthy fats, role of fiber, role of  exercise, red meat intake Other lifestyle changes:  benefits of making changes, increasing motivation, readiness for change, identifying habits that need to change  Nutritional Diagnosis:  [place]-3.3 Overweight/obesity As related to excess caloric intake and inadequate physical activity.  As evidenced by pt current BMI of 47.05.  Intervention:   Discussion and instruction as noted above.  Reviewed multiple benefits of eating high proportion of meals and snacks at home.  Discussed trying meal prepping for some meals and snacks. Also, established goals for additional change.   Increase fruit and vegetable intake   Include vegetables at lunch and dinner  Incorporate more F/V at snacks  include whole fruit at breakfast    Decrease sugar-sweetened beverage consumption   Switch to diet alternatives  Switch to sparkling water or other SF, calorie-free beverages    Stress management   Increase quality of sleep, at follow up discuss link between sleep and weight management   At follow up, discuss Mindful Eating handout    Increase physical activity   Incrementally reintroduce exercise back into routine   150 minutes per week is recommendation    Increase fiber intake   Eat at least 3 servings of whole grains a day  Increase fruit and vegetable intake  Decrease sodium and saturated fat intake   Reduce sodium intake to recommended 1500-2085m/day   Decrease fast food frequency   Decrease red meat consumption to 3 times per week or less   Education Materials given:  [email or web address] Weight Loss Tips . Sip Smarter handout . Hunger Cues handout . Plate Planner with food lists . Goals/  instructions  Learner/ who was taught:  . Patient   Level of understanding: Marland Kitchen Verbalizes/ demonstrates competency  Demonstrated degree of understanding via:   Teach back Learning barriers: . None  Willingness to learn/ readiness for change: . Hesitance, contemplating change  Monitoring  and Evaluation:  Dietary intake, exercise, and body weight      follow up: September 8 at 430p

## 2019-11-11 NOTE — Patient Instructions (Signed)
   Meal prep your lunches for at least one week to try meal prepping  Keep up the great work with physical activity   Keep water intake high, and begin to decrease juice and soda intake

## 2019-11-13 ENCOUNTER — Encounter: Payer: Self-pay | Admitting: Dietician

## 2019-12-23 ENCOUNTER — Ambulatory Visit: Payer: 59 | Admitting: Dietician

## 2020-03-02 ENCOUNTER — Encounter: Payer: Self-pay | Admitting: Dietician

## 2020-03-24 ENCOUNTER — Ambulatory Visit: Payer: Self-pay | Admitting: Emergency Medicine

## 2020-03-30 ENCOUNTER — Other Ambulatory Visit: Payer: Self-pay

## 2020-03-30 DIAGNOSIS — Z1152 Encounter for screening for COVID-19: Secondary | ICD-10-CM

## 2020-03-30 NOTE — Progress Notes (Signed)
Pt presents today with covid symptoms that started three days ago. 03/27/20 cough and sore throat./CL,RMA

## 2020-04-01 LAB — NOVEL CORONAVIRUS, NAA: SARS-CoV-2, NAA: NOT DETECTED

## 2020-04-01 LAB — SPECIMEN STATUS REPORT

## 2020-04-01 LAB — SARS-COV-2, NAA 2 DAY TAT

## 2022-12-25 ENCOUNTER — Emergency Department: Payer: Worker's Compensation

## 2022-12-25 ENCOUNTER — Other Ambulatory Visit: Payer: Self-pay

## 2022-12-25 ENCOUNTER — Emergency Department
Admission: EM | Admit: 2022-12-25 | Discharge: 2022-12-25 | Disposition: A | Discharge status: Home / Self Care | Payer: Worker's Compensation | Attending: Emergency Medicine | Admitting: Emergency Medicine

## 2022-12-25 DIAGNOSIS — S161XXD Strain of muscle, fascia and tendon at neck level, subsequent encounter: Secondary | ICD-10-CM | POA: Insufficient documentation

## 2022-12-25 DIAGNOSIS — Y92410 Unspecified street and highway as the place of occurrence of the external cause: Secondary | ICD-10-CM | POA: Insufficient documentation

## 2022-12-25 DIAGNOSIS — S39012D Strain of muscle, fascia and tendon of lower back, subsequent encounter: Secondary | ICD-10-CM | POA: Insufficient documentation

## 2022-12-25 DIAGNOSIS — S3992XD Unspecified injury of lower back, subsequent encounter: Secondary | ICD-10-CM | POA: Diagnosis present

## 2022-12-25 MED ORDER — ACETAMINOPHEN 500 MG PO TABS
1000.0000 mg | ORAL_TABLET | Freq: Once | ORAL | Status: AC
  Administered 2022-12-25: 1000 mg via ORAL
  Filled 2022-12-25: qty 2

## 2022-12-25 MED ORDER — IBUPROFEN 600 MG PO TABS
600.0000 mg | ORAL_TABLET | Freq: Once | ORAL | Status: AC
  Administered 2022-12-25: 600 mg via ORAL
  Filled 2022-12-25: qty 1
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
